# Patient Record
Sex: Male | Born: 1965 | Race: White | Hispanic: No | Marital: Married | Smoking: Never smoker
Health system: Southern US, Community
[De-identification: ages and names within clinical notes are randomized; demographics above are authoritative.]

## PROBLEM LIST (undated history)

## (undated) DIAGNOSIS — G4733 Obstructive sleep apnea (adult) (pediatric): Secondary | ICD-10-CM

## (undated) DIAGNOSIS — F39 Unspecified mood [affective] disorder: Secondary | ICD-10-CM

## (undated) DIAGNOSIS — K429 Umbilical hernia without obstruction or gangrene: Secondary | ICD-10-CM

## (undated) DIAGNOSIS — K760 Fatty (change of) liver, not elsewhere classified: Secondary | ICD-10-CM

## (undated) DIAGNOSIS — R7301 Impaired fasting glucose: Secondary | ICD-10-CM

## (undated) DIAGNOSIS — K5790 Diverticulosis of intestine, part unspecified, without perforation or abscess without bleeding: Secondary | ICD-10-CM

## (undated) DIAGNOSIS — F419 Anxiety disorder, unspecified: Secondary | ICD-10-CM

## (undated) DIAGNOSIS — E119 Type 2 diabetes mellitus without complications: Secondary | ICD-10-CM

## (undated) DIAGNOSIS — I10 Essential (primary) hypertension: Secondary | ICD-10-CM

## (undated) HISTORY — DX: Impaired fasting glucose: R73.01

## (undated) HISTORY — DX: Unspecified mood (affective) disorder: F39

## (undated) HISTORY — PX: ANKLE ARTHROTOMY: SUR101

## (undated) HISTORY — DX: Obstructive sleep apnea (adult) (pediatric): G47.33

## (undated) HISTORY — PX: CHOLECYSTECTOMY: SHX55

## (undated) HISTORY — DX: Umbilical hernia without obstruction or gangrene: K42.9

## (undated) HISTORY — DX: Anxiety disorder, unspecified: F41.9

## (undated) HISTORY — DX: Diverticulosis of intestine, part unspecified, without perforation or abscess without bleeding: K57.90

## (undated) HISTORY — DX: Fatty (change of) liver, not elsewhere classified: K76.0

---

## 2019-04-14 ENCOUNTER — Emergency Department (HOSPITAL_COMMUNITY)
Admission: EM | Admit: 2019-04-14 | Discharge: 2019-04-14 | Disposition: A | Discharge status: Home / Self Care | Payer: Self-pay | Attending: Emergency Medicine | Admitting: Emergency Medicine

## 2019-04-14 ENCOUNTER — Emergency Department (HOSPITAL_COMMUNITY): Payer: Self-pay

## 2019-04-14 ENCOUNTER — Encounter (HOSPITAL_COMMUNITY): Payer: Self-pay | Admitting: Emergency Medicine

## 2019-04-14 ENCOUNTER — Encounter: Payer: Self-pay | Admitting: Gastroenterology

## 2019-04-14 DIAGNOSIS — K625 Hemorrhage of anus and rectum: Secondary | ICD-10-CM | POA: Insufficient documentation

## 2019-04-14 DIAGNOSIS — K5730 Diverticulosis of large intestine without perforation or abscess without bleeding: Secondary | ICD-10-CM | POA: Insufficient documentation

## 2019-04-14 DIAGNOSIS — K5790 Diverticulosis of intestine, part unspecified, without perforation or abscess without bleeding: Secondary | ICD-10-CM

## 2019-04-14 HISTORY — DX: Essential (primary) hypertension: I10

## 2019-04-14 HISTORY — DX: Type 2 diabetes mellitus without complications: E11.9

## 2019-04-14 LAB — CBC
HCT: 46 % (ref 39.0–52.0)
Hemoglobin: 16 g/dL (ref 13.0–17.0)
MCH: 29.7 pg (ref 26.0–34.0)
MCHC: 34.8 g/dL (ref 30.0–36.0)
MCV: 85.3 fL (ref 80.0–100.0)
Platelets: 269 10*3/uL (ref 150–400)
RBC: 5.39 MIL/uL (ref 4.22–5.81)
RDW: 12.7 % (ref 11.5–15.5)
WBC: 7.3 10*3/uL (ref 4.0–10.5)
nRBC: 0 % (ref 0.0–0.2)

## 2019-04-14 LAB — POC OCCULT BLOOD, ED: Fecal Occult Bld: NEGATIVE

## 2019-04-14 LAB — URINALYSIS, ROUTINE W REFLEX MICROSCOPIC
Bacteria, UA: NONE SEEN
Bilirubin Urine: NEGATIVE
Glucose, UA: >=500 mg/dL — AB
Hgb urine dipstick: NEGATIVE
Ketones, ur: 20 mg/dL — AB
Leukocytes,Ua: NEGATIVE
Nitrite: NEGATIVE
Protein, ur: NEGATIVE mg/dL
Specific Gravity, Urine: 1.033 — ABNORMAL HIGH (ref 1.005–1.030)
pH: 5 (ref 5.0–8.0)

## 2019-04-14 LAB — COMPREHENSIVE METABOLIC PANEL
ALT: 32 U/L (ref 0–44)
AST: 22 U/L (ref 15–41)
Albumin: 4 g/dL (ref 3.5–5.0)
Alkaline Phosphatase: 117 U/L (ref 38–126)
Anion gap: 10 (ref 5–15)
BUN: 12 mg/dL (ref 6–20)
CO2: 23 mmol/L (ref 22–32)
Calcium: 9.2 mg/dL (ref 8.9–10.3)
Chloride: 101 mmol/L (ref 98–111)
Creatinine, Ser: 0.89 mg/dL (ref 0.61–1.24)
GFR calc Af Amer: >60 mL/min (ref >60)
GFR calc non Af Amer: >60 mL/min (ref >60)
Glucose, Bld: 265 mg/dL — ABNORMAL HIGH (ref 70–99)
Potassium: 4.1 mmol/L (ref 3.5–5.1)
Sodium: 134 mmol/L — ABNORMAL LOW (ref 135–145)
Total Bilirubin: 0.8 mg/dL (ref 0.3–1.2)
Total Protein: 7.3 g/dL (ref 6.5–8.1)

## 2019-04-14 LAB — TYPE AND SCREEN
ABO/RH(D): O POS
Antibody Screen: NEGATIVE

## 2019-04-14 LAB — LACTIC ACID, PLASMA: Lactic Acid, Venous: 1.1 mmol/L (ref 0.5–1.9)

## 2019-04-14 LAB — ABO/RH: ABO/RH(D): O POS

## 2019-04-14 LAB — LIPASE, BLOOD: Lipase: 31 U/L (ref 11–51)

## 2019-04-14 MED ORDER — IOHEXOL 300 MG/ML  SOLN
100.0000 mL | Freq: Once | INTRAMUSCULAR | Status: AC | PRN
  Administered 2019-04-14: 100 mL via INTRAVENOUS

## 2019-04-14 MED ORDER — SODIUM CHLORIDE 0.9 % IV BOLUS
1000.0000 mL | Freq: Once | INTRAVENOUS | Status: AC
  Administered 2019-04-14: 11:00:00 1000 mL via INTRAVENOUS

## 2019-04-14 MED ORDER — ONDANSETRON HCL 4 MG/2ML IJ SOLN
4.0000 mg | Freq: Once | INTRAMUSCULAR | Status: AC
  Administered 2019-04-14: 11:00:00 4 mg via INTRAVENOUS
  Filled 2019-04-14: qty 2

## 2019-04-14 MED ORDER — MORPHINE SULFATE (PF) 4 MG/ML IV SOLN
4.0000 mg | Freq: Once | INTRAVENOUS | Status: AC
  Administered 2019-04-14: 11:00:00 4 mg via INTRAVENOUS
  Filled 2019-04-14: qty 1

## 2019-04-14 NOTE — ED Provider Notes (Signed)
[facility] EMERGENCY DEPARTMENT Provider Note   CSN: [id number] Arrival date & time: 04/14/19  0932     History   Chief Complaint Chief Complaint  Patient presents with  . GI Bleeding    HPI [name] is a 53 y.o. male.     The history is provided by the patient, a friend and medical records. No language interpreter was used.  Abdominal Pain Pain location:  Generalized Pain quality: aching and cramping   Pain radiates to:  Does not radiate Pain severity:  Moderate Onset quality:  Gradual Duration:  2 days Timing:  Constant Progression:  Worsening Chronicity:  New Context: previous surgery (chole)   Context: not trauma   Relieved by:  Nothing Worsened by:  Nothing Associated symptoms: fatigue, melena and nausea   Associated symptoms: no chest pain, no chills, no constipation, no cough, no diarrhea, no dysuria, no fever, no flatus, no hematuria, no shortness of breath and no vomiting     No past medical history on file.  There are no active problems to display for this patient.   Past Surgical History:  Procedure Laterality Date  . ANKLE ARTHROTOMY    . CHOLECYSTECTOMY          Home Medications    Prior to Admission medications   Not on File    Family History No family history on file.  Social History Social History   Tobacco Use  . Smoking status: Not on file  Substance Use Topics  . Alcohol use: Not on file  . Drug use: Not on file     Allergies   Patient has no allergy information on record.   Review of Systems Review of Systems  Constitutional: Positive for fatigue. Negative for chills, diaphoresis and fever.  HENT: Negative for congestion and rhinorrhea.   Eyes: Negative for visual disturbance.  Respiratory: Negative for cough, choking, chest tightness, shortness of breath and wheezing.   Cardiovascular: Negative for chest pain.  Gastrointestinal: Positive for abdominal distention, abdominal pain, blood in  stool, melena and nausea. Negative for constipation, diarrhea, flatus and vomiting.  Genitourinary: Negative for dysuria, flank pain, frequency, hematuria, penile pain and testicular pain.  Musculoskeletal: Negative for back pain, neck pain and neck stiffness.  Skin: Negative for rash and wound.  Neurological: Positive for light-headedness. Negative for syncope, weakness, numbness and headaches.  Psychiatric/Behavioral: Negative for agitation.  All other systems reviewed and are negative.    Physical Exam Updated Vital Signs BP (!) 139/96   Pulse 90   Temp 98.5 F (36.9 C) (Oral)   Resp 13   Ht 5\' 10"  (1.778 m)   Wt 86.2 kg   SpO2 97%   BMI 27.26 kg/m   Physical Exam Vitals signs and nursing note reviewed.  Constitutional:      General: He is not in acute distress.    Appearance: He is well-developed. He is not ill-appearing, toxic-appearing or diaphoretic.  HENT:     Head: Normocephalic and atraumatic.     Nose: Nose normal. No congestion or rhinorrhea.     Mouth/Throat:     Mouth: Mucous membranes are dry.     Pharynx: Oropharynx is clear. No oropharyngeal exudate or posterior oropharyngeal erythema.  Eyes:     Extraocular Movements: Extraocular movements intact.     Conjunctiva/sclera: Conjunctivae normal.     Pupils: Pupils are equal, round, and reactive to light.  Neck:     Musculoskeletal: Neck supple. No muscular tenderness.  Cardiovascular:  Rate and Rhythm: Normal rate and regular rhythm.     Pulses: Normal pulses.     Heart sounds: No murmur.  Pulmonary:     Effort: Pulmonary effort is normal. No respiratory distress.     Breath sounds: Normal breath sounds.  Abdominal:     General: Abdomen is flat. Bowel sounds are normal. There is no distension.     Palpations: Abdomen is soft. There is no mass.     Tenderness: There is generalized abdominal tenderness. There is no right CVA tenderness or left CVA tenderness.     Hernia: A hernia is present. Hernia is  present in the umbilical area.     Comments: Periumbilical hernia easily reducible and not tender.  Mild diffuse abdominal tenderness.  Musculoskeletal:        General: No tenderness.     Right lower leg: No edema.     Left lower leg: No edema.  Skin:    General: Skin is warm and dry.     Capillary Refill: Capillary refill takes less than 2 seconds.     Coloration: Skin is pale.     Findings: No erythema or rash.  Neurological:     General: No focal deficit present.     Mental Status: He is alert.  Psychiatric:        Mood and Affect: Mood normal.      ED Treatments / Results  Labs (all labs ordered are listed, but only abnormal results are displayed) Labs Reviewed  COMPREHENSIVE METABOLIC PANEL - Abnormal; Notable for the following components:      Result Value   Sodium 134 (*)    Glucose, Bld 265 (*)    All other components within normal limits  URINALYSIS, ROUTINE W REFLEX MICROSCOPIC - Abnormal; Notable for the following components:   Specific Gravity, Urine 1.033 (*)    Glucose, UA >=500 (*)    Ketones, ur 20 (*)    All other components within normal limits  URINE CULTURE  CBC  LACTIC ACID, PLASMA  LIPASE, BLOOD  LACTIC ACID, PLASMA  POC OCCULT BLOOD, ED  TYPE AND SCREEN  ABO/RH    EKG None  Radiology Ct Abdomen Pelvis W Contrast  Result Date: 04/14/2019 CLINICAL DATA:  Diffuse abdominal pain and distension and rectal bleeding for 2 days. EXAM: CT ABDOMEN AND PELVIS WITH CONTRAST TECHNIQUE: Multidetector CT imaging of the abdomen and pelvis was performed using the standard protocol following bolus administration of intravenous contrast. CONTRAST:  100 mL OMNIPAQUE IOHEXOL 300 MG/ML  SOLN COMPARISON:  None. FINDINGS: Lower chest: Mild dependent atelectasis noted. No pleural or pericardial effusion. Hepatobiliary: No focal liver abnormality is seen. Status post cholecystectomy. No biliary dilatation. The liver is diffusely low attenuating consistent with fatty  infiltration. Pancreas: Unremarkable. No pancreatic ductal dilatation or surrounding inflammatory changes. Spleen: Normal in size without focal abnormality. Adrenals/Urinary Tract: Adrenal glands are unremarkable. Kidneys are normal, without renal calculi, focal lesion, or hydronephrosis. Bladder is unremarkable. Stomach/Bowel: Stomach is within normal limits. Appendix appears normal. No evidence of bowel wall thickening, distention, or inflammatory changes. A few sigmoid diverticula are noted. Vascular/Lymphatic: No significant vascular findings are present. No enlarged abdominal or pelvic lymph nodes. Reproductive: Prostate is unremarkable. Other: Fat containing umbilical hernia is seen. Musculoskeletal: The patient has bilateral L5 pars interarticularis defects without anterolisthesis L5 on S1. No acute bony abnormality is seen. No lytic or sclerotic lesion. IMPRESSION: No acute abnormality or finding to explain the patient's symptoms. Mild sigmoid diverticulosis  without diverticulitis. Fatty infiltration of the liver. Fat containing umbilical hernia. 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 Electronically Signed   By: Inge Rise M.D.   On: 04/14/2019 12:47    Procedures Procedures (including critical care time)  Medications Ordered in ED Medications  sodium chloride 0.9 % bolus 1,000 mL (0 mLs Intravenous Stopped 04/14/19 1232)  ondansetron (ZOFRAN) injection 4 mg (4 mg Intravenous Given 04/14/19 1117)  morphine 4 MG/ML injection 4 mg (4 mg Intravenous Given 04/14/19 1117)  iohexol (OMNIPAQUE) 300 MG/ML solution 100 mL (100 mLs Intravenous Contrast Given 04/14/19 1230)     Initial Impression / Assessment and Plan / ED Course  I have reviewed the triage vital signs and the nursing notes.  Pertinent labs & imaging results that were available during my care of the patient were reviewed by me and considered in my medical decision making (see chart for  details).        James Rollins is a 53 y.o. male with a past medical history significant for hypertension and diabetes who presents with abdominal pain, fatigue, lightheadedness, nausea, and rectal bleeding.  Patient reports that he is never had these symptoms in the past.  He reports that for the last 2 days he has had severe abdominal pain as well as rectal bleeding.  He reports it has been dark blood.  He says that he has had his gallbladder removed in the past and has had a small bulge near his umbilicus that has been going on for the last month or so.  He reports it is not exquisitely tender there.  He reports nausea but no vomiting.  No hematemesis.  No history of hemorrhoids or diverticulitis or diverticulosis.  No history of aortic problems.  He reports his blood pressure was elevated in the past but is currently in the 150s on arrival.  He denies fevers, chills, chest pain, shortness of breath, or palpitations.  He does report feeling lightheaded and got very fatigued when he tries to ambulate.  He thinks he is more pale than normal.  He denies any groin or urinary symptoms. He denies blood thinner use.   On exam, abdomen is mildly tender diffusely.  Fecal occult test was collected with there was no gross blood on my rectal exam.  No hemorrhoids palpated.  Lungs clear and chest is nontender.  Back and flanks nontender.  CVA areas nontender.  Patient does appear pale and mucous membranes and dry mucous membranes.  Patient did have what appears to be a umbilical hernia however it was reducible by me and was not exquisitely tender.  No palpable/pulsatile masses in abdomen.  Patient given pain medicine, nausea, and fluids.  Will get fecal occult test and labs.  Will type and screen him.  Due to the rectal bleeding and abdominal pain with nausea, will get CT scan to look for diverticulitis or diverticulosis.  Patient does report that he is never had a colonoscopy and family with him is concerned about  colon cancer.  Apparently both of the patient's parents had colon cancer and he has not yet been evaluated for this.  The person accompanying him is concerned that this was going on.  Patient was informed that a CT scan is likely not the best way to work-up a possible colon cancer however we do need to rule out diverticulitis today.  We will get the labs, imaging, and medications be administered.  Anticipate reassessment after work-up.   3:26  PM Patient's diagnostic work-up was overall reassuring.  Fecal occult test was negative and patient is not anemic.  Diverticulosis was seen on CT scan, I suspect he is having intermittent bleeding from diverticulosis.  Other work-up was overall reassuring.  Patient agreed with plans to follow-up with a gastroenterologist for likely colonoscopy given strong colon cancer family history.  No other abnormalities were found and patient was feeling better.  Patient be discharged with instructions to maintain hydration and follow-up.  He had otherwise or concerns and was discharged in good condition.   Final Clinical Impressions(s) / ED Diagnoses   Final diagnoses:  Diverticulosis  Rectal bleeding    ED Discharge Orders    None      Clinical Impression: 1. Diverticulosis   2. Rectal bleeding     Disposition: Discharge  Condition: Good  I have discussed the results, Dx and Tx plan with the pt(& family if present). He/she/they expressed understanding and agree(s) with the plan. Discharge instructions discussed at great length. Strict return precautions discussed and pt &/or family have verbalized understanding of the instructions. No further questions at time of discharge.    New Prescriptions   No medications on file    Follow Up: Gastroenterology, [address] [place] [address] [phone]     [facility] [place] [phone]       Tegeler,  Gwenyth Allegra, MD 04/14/19 [phone]

## 2019-04-14 NOTE — Discharge Instructions (Signed)
Your work-up today was significant for diverticulosis is the likely cause of the intermittent rectal bleeding.  We did not see any blood on your work-up today and you are not anemic.  We did not feeling admission but we do think that follow-up with gastroneurology is prudent.  Please call 1 of the 2 gastroenterology groups provided for follow-up and likely colonoscopy.  Please maintain your hydration.  If any symptoms change or worsen, please return to nearest emergency department.

## 2019-04-14 NOTE — ED Notes (Signed)
Pt verbalized understanding of discharge instructions and denies any further questions at this time.   

## 2019-04-14 NOTE — ED Triage Notes (Signed)
Pt with 2 days of dark stools consistent with blood. Denies hx of the same. He also reports abdominal distention and nausea, no vomiting or fever.

## 2019-04-16 LAB — URINE CULTURE: Culture: NO GROWTH

## 2019-04-20 ENCOUNTER — Emergency Department (HOSPITAL_COMMUNITY): Payer: No Typology Code available for payment source

## 2019-04-20 ENCOUNTER — Emergency Department (HOSPITAL_COMMUNITY)
Admission: EM | Admit: 2019-04-20 | Discharge: 2019-04-20 | Disposition: A | Discharge status: Home / Self Care | Payer: No Typology Code available for payment source | Attending: Emergency Medicine | Admitting: Emergency Medicine

## 2019-04-20 ENCOUNTER — Encounter (HOSPITAL_COMMUNITY): Payer: Self-pay | Admitting: Emergency Medicine

## 2019-04-20 ENCOUNTER — Other Ambulatory Visit: Payer: Self-pay

## 2019-04-20 DIAGNOSIS — E119 Type 2 diabetes mellitus without complications: Secondary | ICD-10-CM | POA: Diagnosis not present

## 2019-04-20 DIAGNOSIS — I10 Essential (primary) hypertension: Secondary | ICD-10-CM | POA: Insufficient documentation

## 2019-04-20 DIAGNOSIS — Y990 Civilian activity done for income or pay: Secondary | ICD-10-CM | POA: Insufficient documentation

## 2019-04-20 DIAGNOSIS — M25572 Pain in left ankle and joints of left foot: Secondary | ICD-10-CM | POA: Diagnosis not present

## 2019-04-20 DIAGNOSIS — M25562 Pain in left knee: Secondary | ICD-10-CM | POA: Diagnosis present

## 2019-04-20 DIAGNOSIS — Z7984 Long term (current) use of oral hypoglycemic drugs: Secondary | ICD-10-CM | POA: Insufficient documentation

## 2019-04-20 DIAGNOSIS — Z79899 Other long term (current) drug therapy: Secondary | ICD-10-CM | POA: Diagnosis not present

## 2019-04-20 MED ORDER — HYDROCODONE-ACETAMINOPHEN 5-325 MG PO TABS: 1.0000 | ORAL_TABLET | ORAL | 0 refills | Status: DC | PRN

## 2019-04-20 MED ORDER — HYDROCODONE-ACETAMINOPHEN 5-325 MG PO TABS
1.0000 | ORAL_TABLET | Freq: Once | ORAL | Status: AC
  Administered 2019-04-20: 1 via ORAL
  Filled 2019-04-20: qty 1

## 2019-04-20 NOTE — ED Notes (Signed)
Patient verbalizes understanding of discharge instructions . Opportunity for questions and answers were provided . Armband removed by staff ,Pt discharged from ED. W/C  offered at D/C  and Declined W/C at D/C and was escorted to lobby by RN.  

## 2019-04-20 NOTE — Discharge Instructions (Addendum)
The xray of your left knee was reassuring today - no evidence of breaks.  The x ray of your left ankle did not show any breaks but there is some widening of the ankle joint with concern for a ligament injury.  Please use cam walker and crutches until you can follow up with your orthopedist. It is highly recommended to not bare any weight onto that ankle until you can be seen by them.  I have prescribed short course of pain medication for you to take as needed. It is recommended to try Ibuprofen first and only take the narcotic pain medication if you have continued pain.  While at home please rest, ice, and elevate ankle for comfort.

## 2019-04-20 NOTE — ED Triage Notes (Signed)
Pt reports falling down some steps today and twisting his left knee and ankle. Pt has taken 800 mg motrin.

## 2019-04-20 NOTE — Progress Notes (Signed)
Orthopedic Tech Progress Note Patient Details:  James Rollins 05-Dec-1965 [id number]  Ortho Devices Type of Ortho Device: Crutches, CAM walker Ortho Device/Splint Interventions: Application   Post Interventions Patient Tolerated: Well Instructions Provided: Care of device   [place] Pink 04/20/2019, 5:23 PM

## 2019-04-20 NOTE — ED Provider Notes (Signed)
[facility] EMERGENCY DEPARTMENT Provider Note   CSN: [id number] Arrival date & time: 04/20/19  1419     History   Chief Complaint Chief Complaint  Patient presents with  . Knee Pain  . Ankle Injury    HPI James Rollins is a 53 y.o. male with PMHx HTN and diabetes who presents to the ED today for sudden onset, constant, sharp, left ankle pain s/p injury that occurred this morning.  Reports that he was walking down steps when he rolled his left ankle causing him to fall.  He states he twisted his left knee in the process as well causing pain.  No head injury or loss of consciousness.  Patient did take 800 mg of Motrin around noon with mild relief.  He states worsening pain with ambulation prompting him to come to the ED today.  He reports chronic numbness after surgery on her right foot in the past.  He states this is unchanged.  Denies fever, chills, any other associated symptoms.        Past Medical History:  Diagnosis Date  . Diabetes mellitus without complication ([facility])   . Hypertension     There are no active problems to display for this patient.   Past Surgical History:  Procedure Laterality Date  . ANKLE ARTHROTOMY    . CHOLECYSTECTOMY          Home Medications    Prior to Admission medications   Medication Sig Start Date End Date Taking? Authorizing Provider  acetaminophen (TYLENOL) 325 MG tablet Take 975 mg by mouth every 6 (six) hours as needed.    [provider]  HYDROcodone-acetaminophen (NORCO/VICODIN) 5-325 MG tablet Take 1 tablet by mouth every 4 (four) hours as needed. 04/20/19   Kimmie Doren, PA-C  lisinopril (ZESTRIL) 5 MG tablet Take 5 mg by mouth daily.    [provider]  metFORMIN (GLUCOPHAGE) 500 MG tablet Take 500 mg by mouth 2 (two) times daily with a meal.    [provider]  sertraline (ZOLOFT) 100 MG tablet Take 100 mg by mouth daily.    [provider]    Family History No family  history on file.  Social History Social History   Tobacco Use  . Smoking status: Never Smoker  . Smokeless tobacco: Never Used  Substance Use Topics  . Alcohol use: Not Currently  . Drug use: Not Currently     Allergies   Oxycontin [oxycodone hcl], Onion, and Yellow jacket venom [bee venom]   Review of Systems Review of Systems  Constitutional: Negative for chills and fever.  Musculoskeletal: Positive for arthralgias and joint swelling.  Neurological: Negative for syncope and headaches.     Physical Exam Updated Vital Signs BP (!) 145/88   Pulse 86   Temp 98 F (36.7 C) (Oral)   Resp 15   Ht 5\' 10"  (1.778 m)   Wt 86 kg   SpO2 95%   BMI 27.20 kg/m   Physical Exam Vitals signs and nursing note reviewed.  Constitutional:      Appearance: He is not ill-appearing.  HENT:     Head: Normocephalic and atraumatic.  Eyes:     Conjunctiva/sclera: Conjunctivae normal.  Cardiovascular:     Rate and Rhythm: Normal rate and regular rhythm.  Pulmonary:     Effort: Pulmonary effort is normal.     Breath sounds: Normal breath sounds.  Musculoskeletal:     Comments: Mild swelling noted to left knee as well  as left ankle.  Patient does have tenderness to the lateral aspect of the left knee.  Negative anterior and posterior drawer test.  No varus or valgus laxity of the knee.  Tenderness diffusely to left ankle along the lateral and medial malleolus.  Range of motion limited due to swelling at the ankle.  No tenderness to the foot.  Strength 4 out of 5 with plantarflexion, dorsiflexion, knee extension, knee flexion compared to right.  Sensation intact throughout.  2+ PT and DP pulse.   Skin:    General: Skin is warm and dry.     Coloration: Skin is not jaundiced.  Neurological:     Mental Status: He is alert.      ED Treatments / Results  Labs (all labs ordered are listed, but only abnormal results are displayed) Labs Reviewed - No data to display  EKG None   Radiology Dg Knee 2 Views Left  Result Date: 04/20/2019 CLINICAL DATA:  [place] down steps today with left knee pain. EXAM: LEFT KNEE - 1-2 VIEW COMPARISON:  None. FINDINGS: No evidence of fracture, dislocation, or joint effusion. No evidence of arthropathy or other focal bone abnormality. Soft tissues are unremarkable. IMPRESSION: Negative. Electronically Signed   By: Marin Olp M.D.   On: 04/20/2019 16:05   Dg Ankle Complete Left  Result Date: 04/20/2019 CLINICAL DATA:  Fall down steps today with left ankle pain. EXAM: LEFT ANKLE COMPLETE - 3+ VIEW COMPARISON:  None. FINDINGS: Degenerative changes of the tibiotalar joint with slight widening of the lateral ankle mortise. Multiple with fatigue screws over the talus with ankylosis of the talocalcaneal joint. Mild degenerate change over the midfoot. No acute fracture or dislocation. IMPRESSION: No acute fracture. Chronic and postsurgical changes of the tibiotalar joint and hindfoot. Slight widening of the lateral ankle mortise which may be acute or chronic. Electronically Signed   By: Marin Olp M.D.   On: 04/20/2019 16:07    Procedures Procedures (including critical care time)  Medications Ordered in ED Medications  HYDROcodone-acetaminophen (NORCO/VICODIN) 5-325 MG per tablet 1 tablet (1 tablet Oral Given 04/20/19 1649)     Initial Impression / Assessment and Plan / ED Course  I have reviewed the triage vital signs and the nursing notes.  Pertinent labs & imaging results that were available during my care of the patient were reviewed by me and considered in my medical decision making (see chart for details).    53 year old male who presents to the ED after ankle and knee injury today.  Was walking down steps when he twisted his ankle and his knee causing immediate pain.  Joints mildly swollen with tenderness to palpation.  No ligamentous injury to knee.  Xray of knee obtained which was negative.  Stray of ankle was obtained which was  negative for fractures.  There does appear to be some widening of the mortise which radiologist reported could either be acute or chronic.  Does have orthopedist locally.  I have provided Cam walker and crutches in the ED today as well as pain medication.  I have advised strictly to not bear weight on the ankle as there is some concern for ligamentous injury until he can follow-up with orthopedics.   This note was prepared using Dragon voice recognition software and may include unintentional dictation errors due to the inherent limitations of voice recognition software.       Final Clinical Impressions(s) / ED Diagnoses   Final diagnoses:  Acute pain of left knee  Acute left ankle pain    ED Discharge Orders         Ordered    HYDROcodone-acetaminophen (NORCO/VICODIN) 5-325 MG tablet  Every 4 hours PRN     04/20/19 1708           Eustaquio Maize, PA-C 04/20/19 1726    Elnora Morrison, MD 04/22/19 [phone]

## 2019-04-20 NOTE — ED Notes (Signed)
Patient transported to X-ray 

## 2019-05-15 ENCOUNTER — Ambulatory Visit: Payer: Self-pay | Admitting: Gastroenterology

## 2019-06-11 ENCOUNTER — Ambulatory Visit (INDEPENDENT_AMBULATORY_CARE_PROVIDER_SITE_OTHER): Payer: BC Managed Care – PPO | Admitting: Gastroenterology

## 2019-06-11 ENCOUNTER — Encounter: Payer: Self-pay | Admitting: Gastroenterology

## 2019-06-11 VITALS — BP 120/82 | HR 102 | Temp 97.5°F | Ht 70.0 in | Wt 200.0 lb

## 2019-06-11 DIAGNOSIS — K625 Hemorrhage of anus and rectum: Secondary | ICD-10-CM

## 2019-06-11 DIAGNOSIS — K529 Noninfective gastroenteritis and colitis, unspecified: Secondary | ICD-10-CM | POA: Diagnosis not present

## 2019-06-11 DIAGNOSIS — Z1159 Encounter for screening for other viral diseases: Secondary | ICD-10-CM | POA: Diagnosis not present

## 2019-06-11 MED ORDER — COLESEVELAM HCL 625 MG PO TABS: 625.0000 mg | ORAL_TABLET | Freq: Two times a day (BID) | ORAL | 0 refills | Status: DC

## 2019-06-11 MED ORDER — NA SULFATE-K SULFATE-MG SULF 17.5-3.13-1.6 GM/177ML PO SOLN: 1.0000 | Freq: Once | ORAL | 0 refills | Status: AC

## 2019-06-11 NOTE — Progress Notes (Signed)
[facility] Consult Note:  History: James Rollins 06/11/2019  Referring provider:   Reason for consult/chief complaint: Rectal bleeding  Subjective  HPI:  James Rollins went to the emergency department in late September for about 2 days of small-volume rectal bleeding.  He was having some lower abdominal bloating but says there was no real pain at the time.  ED provider note was reviewed, he had work-up as noted below and stool was reportedly heme negative.  Symptoms resolved after that, but he felt he should be evaluated since he had never had a colonoscopy.  ED provider note indicated history of colon cancer in both parents, but James Rollins says there is no family history of colon cancer.  He also has been bothered by chronic diarrhea since his cholecystectomy many years ago.  He has 3-5 loose stools per day, usually soon after eating.  He had not had any previous testing or taking any medicines except for occasional Imodium.  ROS:  Review of Systems  Constitutional: Negative for appetite change and unexpected weight change.  HENT: Negative for mouth sores and voice change.   Eyes: Negative for pain and redness.  Respiratory: Negative for cough and shortness of breath.   Cardiovascular: Negative for chest pain and palpitations.  Genitourinary: Negative for dysuria and hematuria.  Musculoskeletal: Negative for arthralgias and myalgias.       Left foot pain after recent injury.  Currently in an orthopedic boot.  Skin: Negative for pallor and rash.  Neurological: Negative for weakness and headaches.  Hematological: Negative for adenopathy.     Past Medical History: Past Medical History:  Diagnosis Date  . Anxiety   . Diabetes mellitus without complication ([place])   . Diverticulosis   . Fatty liver    per CT 03-2019  . Hypertension   . Impaired fasting glucose   . Mood disorder ([place])   . OSA (obstructive sleep apnea)   . Umbilical hernia    per CT- 04-14-2019      Past Surgical History: Past Surgical History:  Procedure Laterality Date  . ANKLE ARTHROTOMY    . CHOLECYSTECTOMY       Family History: Family History  Problem Relation Age of Onset  . Brain cancer Mother   . Hypertension Father   . Hyperlipidemia Father   . Diabetes Mellitus II Father   . Heart disease Father   . COPD Father     Social History: Social History   Socioeconomic History  . Marital status: Married    Spouse name: Not on file  . Number of children: Not on file  . Years of education: Not on file  . Highest education level: Not on file  Occupational History  . Not on file  Social Needs  . Financial resource strain: Not on file  . Food insecurity    Worry: Not on file    Inability: Not on file  . Transportation needs    Medical: Not on file    Non-medical: Not on file  Tobacco Use  . Smoking status: Never Smoker  . Smokeless tobacco: Never Used  Substance and Sexual Activity  . Alcohol use: Not Currently  . Drug use: Not Currently  . Sexual activity: Not on file  Lifestyle  . Physical activity    Days per week: Not on file    Minutes per session: Not on file  . Stress: Not on file  Relationships  . Social connections    Talks on phone: Not on file  Gets together: Not on file    Attends religious service: Not on file    Active member of club or organization: Not on file    Attends meetings of clubs or organizations: Not on file    Relationship status: Not on file  Other Topics Concern  . Not on file  Social History Narrative  . Not on file    Allergies: Allergies  Allergen Reactions  . Oxycontin [Oxycodone Hcl] Other (See Comments)    Mood swings  . Onion Other (See Comments)    Dries out throat  . [place]et Venom [Bee Venom] Nausea Only and Other (See Comments)    Outpatient Meds: Current Outpatient Medications  Medication Sig Dispense Refill  . acetaminophen (TYLENOL) 325 MG tablet Take 975 mg by mouth every 6 (six) hours  as needed.    Marland Kitchen HYDROcodone-acetaminophen (NORCO/VICODIN) 5-325 MG tablet Take 1 tablet by mouth every 4 (four) hours as needed. 10 tablet 0  . lisinopril (ZESTRIL) 5 MG tablet Take 5 mg by mouth daily.    . metFORMIN (GLUCOPHAGE) 500 MG tablet Take 500 mg by mouth 2 (two) times daily with a meal.    . sertraline (ZOLOFT) 100 MG tablet Take 100 mg by mouth daily.    . colesevelam (WELCHOL) 625 MG tablet Take 1 tablet (625 mg total) by mouth 2 (two) times daily with a meal for 14 days. 28 tablet 0  . Na Sulfate-K Sulfate-Mg Sulf 17.5-3.13-1.6 GM/177ML SOLN Take 1 kit by mouth once for 1 dose. 354 mL 0   No current facility-administered medications for this visit.       ___________________________________________________________________ Objective   Exam:  BP 120/82 (BP Location: Left Arm, Patient Position: Sitting, Cuff Size: Normal)   Pulse (!) 102   Temp (!) 97.5 F (36.4 C) (Other (Comment))   Ht _0  (1.778 m)   Wt 200 lb (90.7 kg)   BMI 28.70 kg/m    General: Well-appearing  Eyes: sclera anicteric, no redness  ENT: oral mucosa moist without lesions, no cervical or supraclavicular lymphadenopathy  CV: RRR without murmur, S1/S2, no JVD, no peripheral edema  Resp: clear to auscultation bilaterally, normal RR and effort noted  GI: soft, no tenderness, with active bowel sounds. No guarding or palpable organomegaly noted.  Skin; warm and dry, no rash or jaundice noted  Neuro: awake, alert and oriented x 3. Normal gross motor function and fluent speech Rectal exam was deferred.  Done during ED visit and will have upcoming procedure Labs:  CBC Latest Ref Rng & Units 04/14/2019  WBC 4.0 - 10.5 K/uL 7.3  Hemoglobin 13.0 - 17.0 g/dL 16.0  Hematocrit 39.0 - 52.0 % 46.0  Platelets 150 - 400 K/uL 269   CMP Latest Ref Rng & Units 04/14/2019  Glucose 70 - 99 mg/dL 265(H)  BUN 6 - 20 mg/dL 12  Creatinine 0.61 - 1.24 mg/dL 0.89  Sodium 135 - 145 mmol/L 134(L)  Potassium 3.5  - 5.1 mmol/L 4.1  Chloride 98 - 111 mmol/L 101  CO2 22 - 32 mmol/L 23  Calcium 8.9 - 10.3 mg/dL 9.2  Total Protein 6.5 - 8.1 g/dL 7.3  Total Bilirubin 0.3 - 1.2 mg/dL 0.8  Alkaline Phos 38 - 126 U/L 117  AST 15 - 41 U/L 22  ALT 0 - 44 U/L 32     Radiologic Studies:  CLINICAL DATA:  Diffuse abdominal pain and distension and rectal bleeding for 2 days.   EXAM: CT ABDOMEN AND PELVIS  WITH CONTRAST   TECHNIQUE: Multidetector CT imaging of the abdomen and pelvis was performed using the standard protocol following bolus administration of intravenous contrast.   CONTRAST:  100 mL OMNIPAQUE IOHEXOL 300 MG/ML  SOLN   COMPARISON:  None.   FINDINGS: Lower chest: Mild dependent atelectasis noted. No pleural or pericardial effusion.   Hepatobiliary: No focal liver abnormality is seen. Status post cholecystectomy. No biliary dilatation. The liver is diffusely low attenuating consistent with fatty infiltration.   Pancreas: Unremarkable. No pancreatic ductal dilatation or surrounding inflammatory changes.   Spleen: Normal in size without focal abnormality.   Adrenals/Urinary Tract: Adrenal glands are unremarkable. Kidneys are normal, without renal calculi, focal lesion, or hydronephrosis. Bladder is unremarkable.   Stomach/Bowel: Stomach is within normal limits. Appendix appears normal. No evidence of bowel wall thickening, distention, or inflammatory changes. A few sigmoid diverticula are noted.   Vascular/Lymphatic: No significant vascular findings are present. No enlarged abdominal or pelvic lymph nodes.   Reproductive: Prostate is unremarkable.   Other: Fat containing umbilical hernia is seen.   Musculoskeletal: The patient has bilateral L5 pars interarticularis defects without anterolisthesis L5 on S1. No acute bony abnormality is seen. No lytic or sclerotic lesion.   IMPRESSION: No acute abnormality or finding to explain the patient's symptoms.   Mild sigmoid  diverticulosis without diverticulitis.   Fatty infiltration of the liver.   Fat containing umbilical hernia.   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     Electronically Signed   By: Inge Rise M.D.   On: 04/14/2019 12:47   Assessment: Encounter Diagnoses  Name Primary?  . Rectal bleeding Yes  . Chronic diarrhea   . Screening for viral disease     Recent self-limited small-volume rectal bleeding, normal hemoglobin and CT scan.  Probably benign anorectal bleeding, but certainly should have colonoscopy for further evaluation.  Chronic diarrhea ever since cholecystectomy, sounds like to be bile acid diarrhea.  We discussed the nature of the condition and available treatments.  Plan:  Colonoscopy scheduled.  He was agreeable after discussion of procedure and risks.  The benefits and risks of the planned procedure were described in detail with the patient or (when appropriate) their health care proxy.  Risks were outlined as including, but not limited to, bleeding, infection, perforation, adverse medication reaction leading to cardiac or pulmonary decompensation, pancreatitis (if ERCP).  The limitation of incomplete mucosal visualization was also discussed.  No guarantees or warranties were given.  Trial of WelChol 625 mg, 1 tablet twice daily with meals.  14-day trial was given.  Thank you for the courtesy of this consult.  Please call me with any questions or concerns.  Nelida Meuse III  CC: Referring provider noted above

## 2019-06-11 NOTE — Patient Instructions (Addendum)
If you are age 51 or older, your body mass index should be between 23-30. Your Body mass index is 28.7 kg/m. If this is out of the aforementioned range listed, please consider follow up with your Primary Care Provider.  If you are age 44 or younger, your body mass index should be between 19-25. Your Body mass index is 28.7 kg/m. If this is out of the aformentioned range listed, please consider follow up with your Primary Care Provider.   You have been scheduled for a colonoscopy. Please follow written instructions given to you at your visit today.  Please pick up your prep supplies at the pharmacy within the next 1-3 days. If you use inhalers (even only as needed), please bring them with you on the day of your procedure. Your physician has requested that you go to www.startemmi.com and enter the access code given to you at your visit today. This web site gives a general overview about your procedure. However, you should still follow specific instructions given to you by our office regarding your preparation for the procedure.  Due to recent COVID-19 restrictions implemented by [employer] and state authorities and in an effort to keep both patients and staff as safe as possible, [place] requires COVID-19 testing prior to any scheduled endoscopic procedure. The testing center is located at [address] [place], [place] [address] in the [facility]  suite.  Your appointment has been scheduled for 06-19-2019 at 10:50am.   Please bring your insurance cards to this appointment. You will require your COVID screen 2 business days prior to your endoscopic procedure.  You are not required to quarantine after your screening.  You will only receive a phone call with the results if it is POSITIVE.  If you do not receive a call the day before your procedure you should begin your prep, if ordered, and you should report to the endo center for your  procedure at your designated appointment arrival time ( one hour prior to the procedure time). There is no cost to you for the screening on the day of the swab.  [facility] will file with your insurance company for the testing.    You may receive an automated phone call prior to your procedure or have a message in your MyChart that you have an appointment for a BP/15 at the [facility], please disregard this message.  Your testing will be at the [place] [place] location.   If you are leaving [facility] travel [place] on [address], turn left onto [address], turn night onto [address] at the 1st stop light turn right, pass the [employer] on your right and proceed to [address] (white building).   It was a pleasure to see you today!  Dr. Loletha Carrow

## 2019-06-19 ENCOUNTER — Ambulatory Visit (INDEPENDENT_AMBULATORY_CARE_PROVIDER_SITE_OTHER): Payer: BC Managed Care – PPO

## 2019-06-19 ENCOUNTER — Encounter: Payer: Self-pay | Admitting: Gastroenterology

## 2019-06-19 ENCOUNTER — Other Ambulatory Visit: Payer: Self-pay | Admitting: Gastroenterology

## 2019-06-19 DIAGNOSIS — Z1159 Encounter for screening for other viral diseases: Secondary | ICD-10-CM

## 2019-06-20 LAB — SARS CORONAVIRUS 2 (TAT 6-24 HRS): SARS Coronavirus 2: NEGATIVE

## 2019-06-23 ENCOUNTER — Other Ambulatory Visit: Payer: Self-pay | Admitting: Gastroenterology

## 2019-06-23 ENCOUNTER — Ambulatory Visit (AMBULATORY_SURGERY_CENTER): Payer: BC Managed Care – PPO | Admitting: Gastroenterology

## 2019-06-23 ENCOUNTER — Other Ambulatory Visit: Payer: Self-pay

## 2019-06-23 ENCOUNTER — Encounter: Payer: Self-pay | Admitting: Gastroenterology

## 2019-06-23 VITALS — BP 113/73 | HR 84 | Temp 98.7°F | Resp 16 | Ht 70.0 in | Wt 200.0 lb

## 2019-06-23 DIAGNOSIS — K5730 Diverticulosis of large intestine without perforation or abscess without bleeding: Secondary | ICD-10-CM | POA: Diagnosis not present

## 2019-06-23 DIAGNOSIS — D122 Benign neoplasm of ascending colon: Secondary | ICD-10-CM

## 2019-06-23 DIAGNOSIS — K625 Hemorrhage of anus and rectum: Secondary | ICD-10-CM

## 2019-06-23 MED ORDER — COLESEVELAM HCL 625 MG PO TABS: 625.0000 mg | ORAL_TABLET | Freq: Two times a day (BID) | ORAL | 5 refills | Status: DC

## 2019-06-23 MED ORDER — SODIUM CHLORIDE 0.9 % IV SOLN: 500.0000 mL | Freq: Once | INTRAVENOUS | Status: DC

## 2019-06-23 NOTE — Progress Notes (Signed)
PT taken to PACU. Monitors in place. VSS. Report given to RN. 

## 2019-06-23 NOTE — Progress Notes (Signed)
Called to room to assist during endoscopic procedure.  Patient ID and intended procedure confirmed with present staff. Received instructions for my participation in the procedure from the performing physician.  

## 2019-06-23 NOTE — Patient Instructions (Signed)
Please read handouts provided. Continue present medications. Await pathology results.        YOU HAD AN ENDOSCOPIC PROCEDURE TODAY AT THE  ENDOSCOPY CENTER:   Refer to the procedure report that was given to you for any specific questions about what was found during the examination.  If the procedure report does not answer your questions, please call your gastroenterologist to clarify.  If you requested that your care partner not be given the details of your procedure findings, then the procedure report has been included in a sealed envelope for you to review at your convenience later.  YOU SHOULD EXPECT: Some feelings of bloating in the abdomen. Passage of more gas than usual.  Walking can help get rid of the air that was put into your GI tract during the procedure and reduce the bloating. If you had a lower endoscopy (such as a colonoscopy or flexible sigmoidoscopy) you may notice spotting of blood in your stool or on the toilet paper. If you underwent a bowel prep for your procedure, you may not have a normal bowel movement for a few days.  Please Note:  You might notice some irritation and congestion in your nose or some drainage.  This is from the oxygen used during your procedure.  There is no need for concern and it should clear up in a day or so.  SYMPTOMS TO REPORT IMMEDIATELY:   Following lower endoscopy (colonoscopy or flexible sigmoidoscopy):  Excessive amounts of blood in the stool  Significant tenderness or worsening of abdominal pains  Swelling of the abdomen that is new, acute  Fever of 100F or higher    For urgent or emergent issues, a gastroenterologist can be reached at any hour by calling [phone].   DIET:  We do recommend a small meal at first, but then you may proceed to your regular diet.  Drink plenty of fluids but you should avoid alcoholic beverages for 24 hours.  ACTIVITY:  You should plan to take it easy for the rest of today and you should NOT  DRIVE or use heavy machinery until tomorrow (because of the sedation medicines used during the test).    FOLLOW UP: Our staff will call the number listed on your records 48-72 hours following your procedure to check on you and address any questions or concerns that you may have regarding the information given to you following your procedure. If we do not reach you, we will leave a message.  We will attempt to reach you two times.  During this call, we will ask if you have developed any symptoms of COVID 19. If you develop any symptoms (ie: fever, flu-like symptoms, shortness of breath, cough etc.) before then, please call [phone].  If you test positive for Covid 19 in the 2 weeks post procedure, please call and report this information to us.    If any biopsies were taken you will be contacted by phone or by letter within the next 1-3 weeks.  Please call us at [phone] if you have not heard about the biopsies in 3 weeks.    SIGNATURES/CONFIDENTIALITY: You and/or your care partner have signed paperwork which will be entered into your electronic medical record.  These signatures attest to the fact that that the information above on your After Visit Summary has been reviewed and is understood.  Full responsibility of the confidentiality of this discharge information lies with you and/or your care-partner. 

## 2019-06-23 NOTE — Op Note (Signed)
[facility] Patient Name: James Rollins Procedure Date: 06/23/2019 11:07 AM MRN: [id number] Endoscopist: Douglas. Loletha Carrow , MD Age: 53 Referring MD:  Date of Birth: Oct 28, 1965 Gender: Male Account #: [id number] Procedure:                Colonoscopy Indications:              Rectal bleeding Medicines:                Monitored Anesthesia Care Procedure:                Pre-Anesthesia Assessment:                           - Prior to the procedure, a History and Physical                            was performed, and patient medications and                            allergies were reviewed. The patient's tolerance of                            previous anesthesia was also reviewed. The risks                            and benefits of the procedure and the sedation                            options and risks were discussed with the patient.                            All questions were answered, and informed consent                            was obtained. Prior Anticoagulants: The patient has                            taken no previous anticoagulant or antiplatelet                            agents. ASA Grade Assessment: II - A patient with                            mild systemic disease. After reviewing the risks                            and benefits, the patient was deemed in                            satisfactory condition to undergo the procedure.                           After obtaining informed consent, the colonoscope  was passed under direct vision. Throughout the                            procedure, the patient's blood pressure, pulse, and                            oxygen saturations were monitored continuously. The                            Colonoscope was introduced through the anus and                            advanced to the the cecum, identified by                            appendiceal orifice and ileocecal valve. The                colonoscopy was performed without difficulty. The                            patient tolerated the procedure well. The quality                            of the bowel preparation was excellent. The                            ileocecal valve, appendiceal orifice, and rectum                            were photographed. Scope In: 11:15:11 AM Scope Out: 11:27:07 AM Scope Withdrawal Time: 0 hours 8 minutes 56 seconds  Total Procedure Duration: 0 hours 11 minutes 56 seconds  Findings:                 The perianal and digital rectal examinations were                            normal.                           A 4 mm polyp was found in the ascending colon. The                            polyp was sessile. The polyp was removed with a                            cold snare. Resection and retrieval were complete.                           Diverticula were found in the left colon.                           The exam was otherwise without abnormality on  direct and retroflexion views. Complications:            No immediate complications. Estimated Blood Loss:     Estimated blood loss was minimal. Impression:               - One 4 mm polyp in the ascending colon, removed                            with a cold snare. Resected and retrieved.                           - Diverticulosis in the left colon.                           - The examination was otherwise normal on direct                            and retroflexion views.                           Recent episode self-limited benign anal bleeding. Recommendation:           - Patient has a contact number available for                            emergencies. The signs and symptoms of potential                            delayed complications were discussed with the                            patient. Return to normal activities tomorrow.                            Written discharge instructions were provided to  the                            patient.                           - Resume previous diet.                           - Continue present medications.                           - Await pathology results.                           - Repeat colonoscopy is recommended for                            surveillance. The colonoscopy date will be                            determined after pathology results from today's  exam become available for review. James Rollins L. Loletha Carrow, MD 06/23/2019 11:30:06 AM This report has been signed electronically.

## 2019-06-25 ENCOUNTER — Telehealth: Payer: Self-pay

## 2019-06-25 NOTE — Telephone Encounter (Signed)
  Follow up Call-  Call back number 06/23/2019  Post procedure Call Back phone  # [phone]  Permission to leave phone message Yes     Patient questions:  Do you have a fever, pain , or abdominal swelling? No. Pain Score  0 *  Have you tolerated food without any problems? Yes.    Have you been able to return to your normal activities? Yes.    Do you have any questions about your discharge instructions: Diet   No. Medications  No. Follow up visit  No.  Do you have questions or concerns about your Care? No.  Actions: * If pain score is 4 or above: No action needed, pain <4.  1. Have you developed a fever since your procedure? no  2.   Have you had an respiratory symptoms (SOB or cough) since your procedure? no  3.   Have you tested positive for COVID 19 since your procedure no  4.   Have you had any family members/close contacts diagnosed with the COVID 19 since your procedure?  no   If yes to any of these questions please route to Joylene San, RN and Alphonsa Gin, Therapist, sports.

## 2019-06-26 ENCOUNTER — Encounter: Payer: Self-pay | Admitting: Gastroenterology

## 2020-02-13 ENCOUNTER — Emergency Department (HOSPITAL_COMMUNITY)
Admission: EM | Admit: 2020-02-13 | Discharge: 2020-02-14 | Disposition: A | Discharge status: Home / Self Care | Payer: BC Managed Care – PPO | Attending: Emergency Medicine | Admitting: Emergency Medicine

## 2020-02-13 ENCOUNTER — Encounter (HOSPITAL_COMMUNITY): Payer: Self-pay | Admitting: Emergency Medicine

## 2020-02-13 DIAGNOSIS — Z5321 Procedure and treatment not carried out due to patient leaving prior to being seen by health care provider: Secondary | ICD-10-CM | POA: Diagnosis not present

## 2020-02-13 DIAGNOSIS — M25562 Pain in left knee: Secondary | ICD-10-CM | POA: Diagnosis not present

## 2020-02-13 NOTE — ED Triage Notes (Signed)
Patient reports L knee pain post arthroscopy with debridement of meniscus. Was told by surgeon to report to ED if pain persisted.

## 2020-02-14 NOTE — ED Notes (Signed)
Pt checked out AMA. 

## 2020-11-03 IMAGING — DX DG KNEE 1-2V*L*
2 series · 2 of 2 positions shown · non-contrast
Comparison: None.

CLINICAL DATA: Fell down steps today with left knee pain.

EXAM:
LEFT KNEE - 1-2 VIEW

[knee ap]
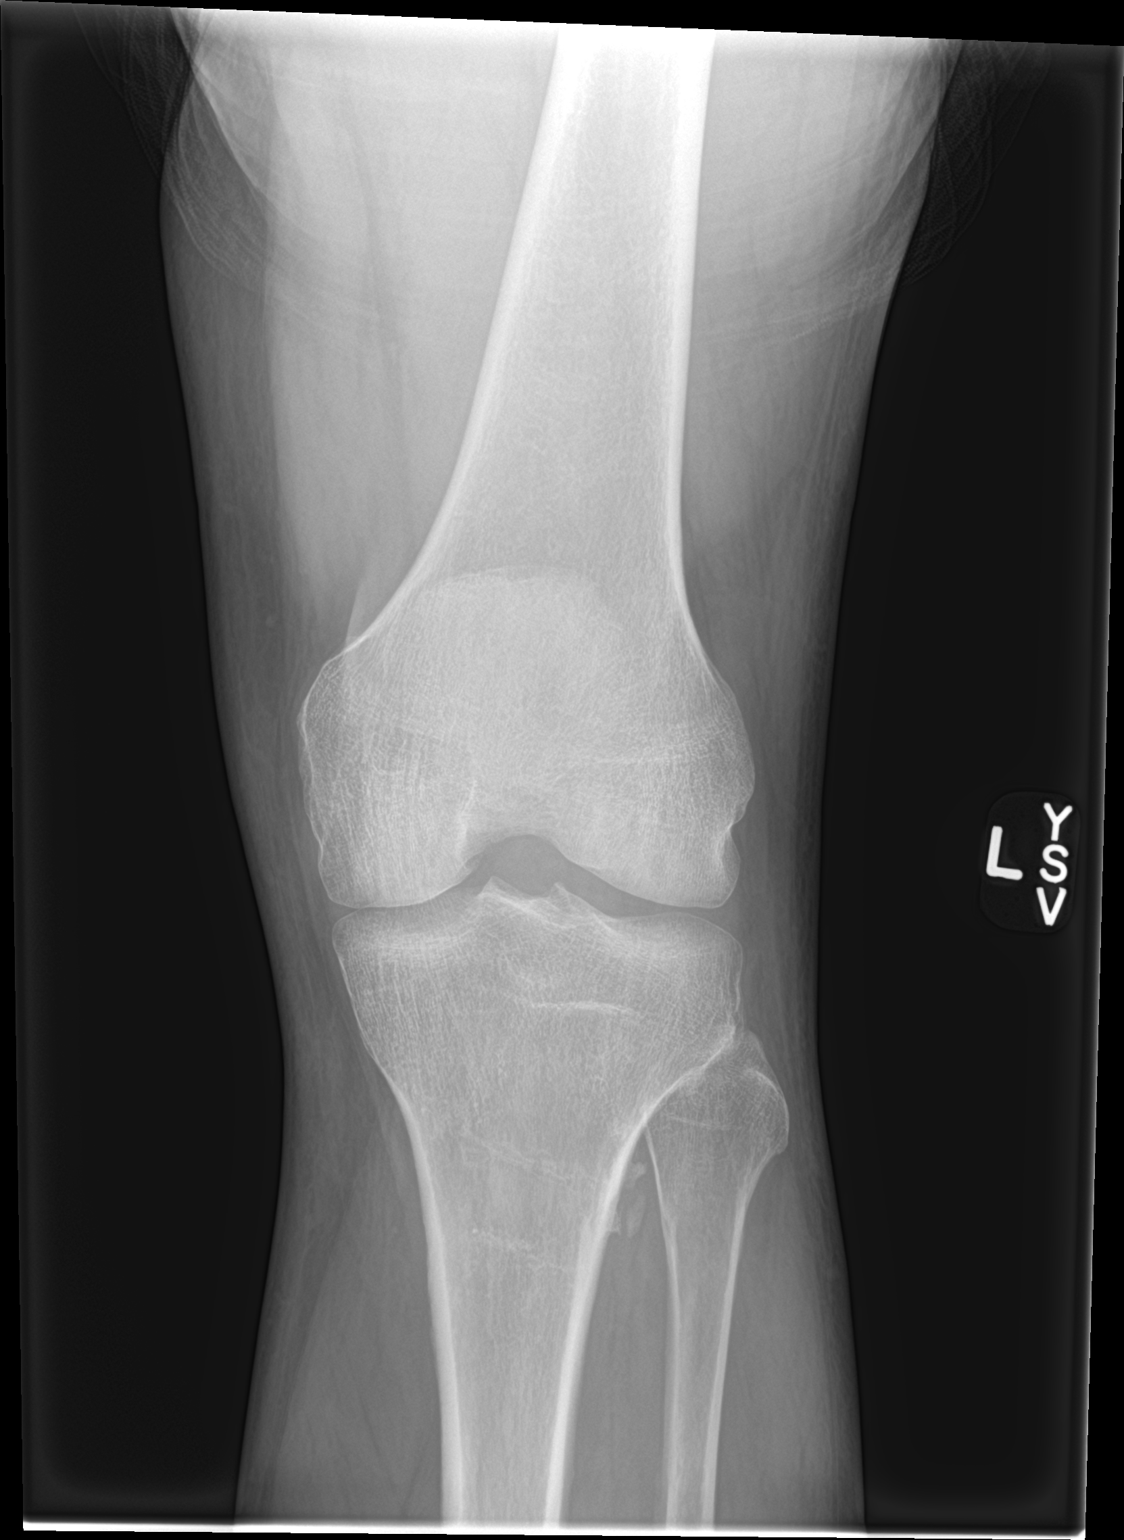

[knee lat]
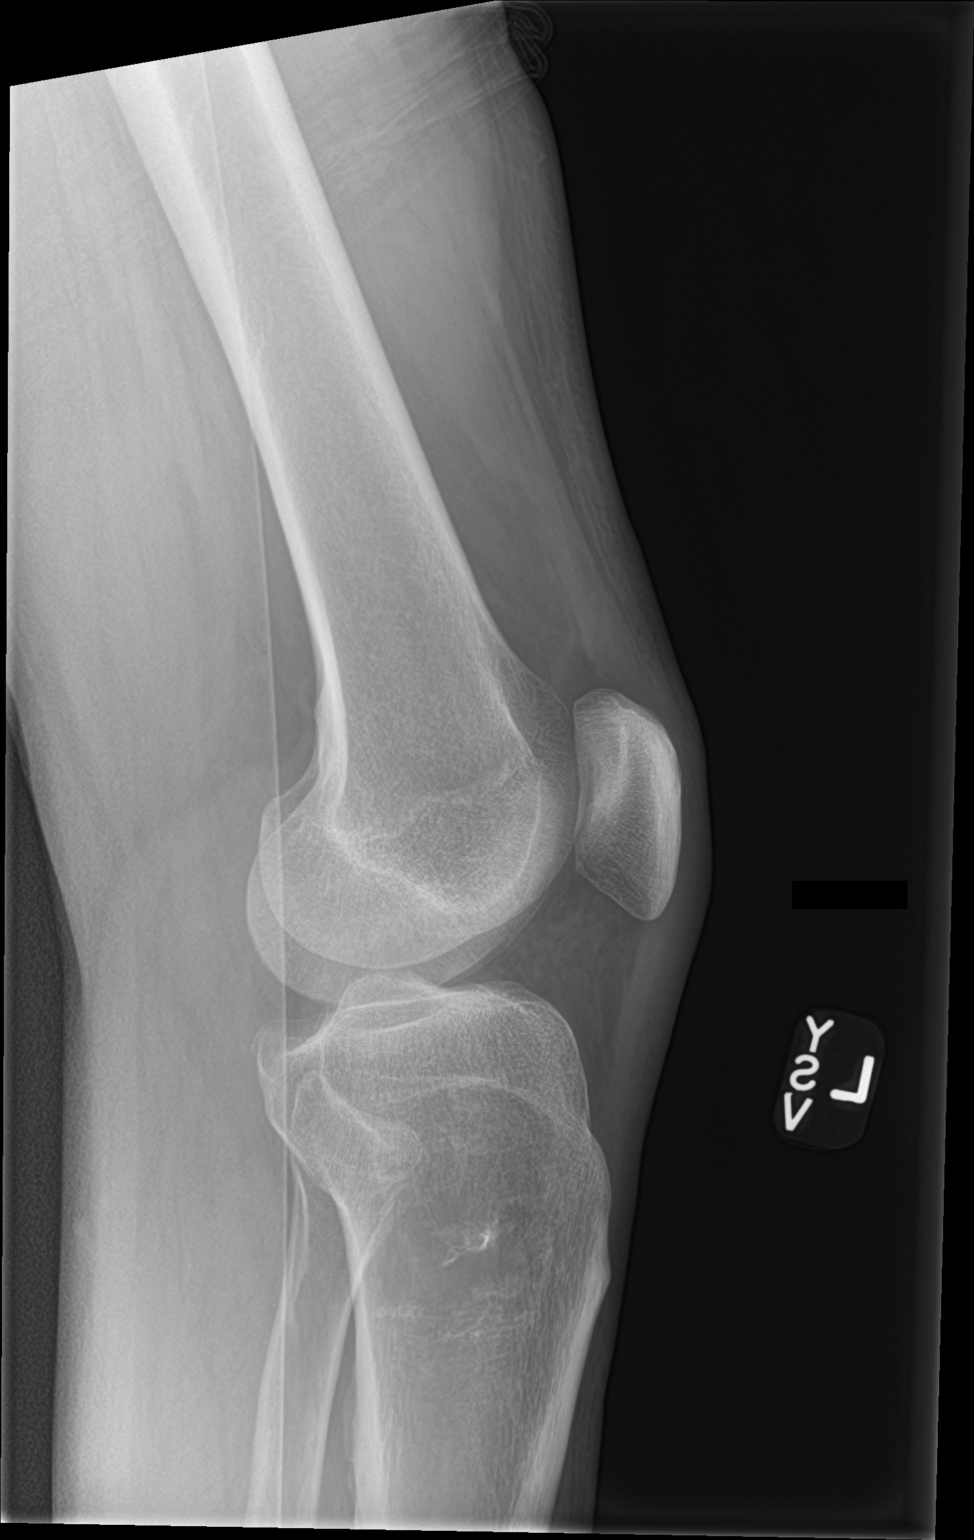

[2 of 2 positions shown; findings below may reference images not displayed]

FINDINGS: No evidence of fracture, dislocation, or joint effusion. No evidence
of arthropathy or other focal bone abnormality. Soft tissues are
unremarkable.
IMPRESSION: Negative.

## 2021-06-23 ENCOUNTER — Ambulatory Visit: Payer: Self-pay | Admitting: Physician Assistant

## 2021-08-02 ENCOUNTER — Ambulatory Visit: Payer: Self-pay | Admitting: Gastroenterology

## 2022-09-20 ENCOUNTER — Ambulatory Visit (HOSPITAL_COMMUNITY)
Admission: EM | Admit: 2022-09-20 | Discharge: 2022-09-20 | Disposition: A | Discharge status: Home / Self Care | Payer: No Payment, Other | Attending: Family | Admitting: Family

## 2022-09-20 DIAGNOSIS — R5383 Other fatigue: Secondary | ICD-10-CM | POA: Insufficient documentation

## 2022-09-20 DIAGNOSIS — F32A Depression, unspecified: Secondary | ICD-10-CM | POA: Insufficient documentation

## 2022-09-20 DIAGNOSIS — F4310 Post-traumatic stress disorder, unspecified: Secondary | ICD-10-CM | POA: Insufficient documentation
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
## 2022-09-20 NOTE — Discharge Instructions (Addendum)
Patient is instructed prior to discharge to:  Take all medications as prescribed by his/her mental healthcare provider. Report any adverse effects and or reactions from the medicines to his/her outpatient provider promptly. Keep all scheduled appointments, to ensure that you are getting refills on time and to avoid any interruption in your medication.  If you are unable to keep an appointment call to reschedule.  Be sure to follow-up with resources and follow-up appointments provided.  Patient has been instructed & cautioned: To not engage in alcohol and or illegal drug use while on prescription medicines. In the event of worsening symptoms, patient is instructed to call the crisis hotline, 911 and or go to the nearest ED for appropriate evaluation and treatment of symptoms. To follow-up with his/her primary care provider for your other medical issues, concerns and or health care needs.  Information: -National Suicide Prevention Lifeline 1-800-SUICIDE or 8721242558.  -988 offers 24/7 access to trained crisis counselors who can help people experiencing mental health-related distress. People can call or text 988 or chat 988lifeline.org for themselves or if they are worried about a loved one who may need crisis support.

## 2022-09-20 NOTE — Progress Notes (Signed)
   09/20/22 1846  [place] Triage Screening (Walk-ins at [facility] only)  How Did You Hear About [place]? Self  What Is the Reason for Your Visit/Call Today? Pt is a 57 yo male who presented voluntarily and accompanied by his fiance, [name], due to worsening depression and "a meltdown" in which he has been feeling more depressed and crying =. Pt denied SI, HI, NSSH, AVH, paranoia and any substance use. Pt has no current OP psychiatric OP providers. Hx of MDD, GAD and PTSD.  How Long Has This Been Causing You Problems? 1 wk - 1 month  Have You Recently Had Any Thoughts About Hurting Yourself? No  Are You Planning to Commit Suicide/Harm Yourself At This time? No  Have you Recently Had Thoughts About [place]? No  Are You Planning To Harm Someone At This Time? No  Are you currently experiencing any auditory, visual or other hallucinations? No  Have You Used Any Alcohol or Drugs in the Past 24 Hours? No  Do you have any current medical co-morbidities that require immediate attention? No  Clinician description of patient physical appearance/behavior: Pt was calm, cooperative, alert and seemed oriented. Pt's mood was depressed and his flat affect was congruent. At times he interjected humor possibly to deflect. Pt's judgment and insight seemed fair.  What Do You Feel Would Help You the Most Today? Treatment for Depression or other mood problem  If access to [facility] was not available, would you have sought care in the Emergency Department? Yes  Determination of Need Routine (7 days)  Options For Referral Medication Management;Outpatient Therapy   Kanoe Wanner T. Mare Ferrari, [place], [facility], [facility] Triage Specialist [facility]

## 2022-09-21 ENCOUNTER — Encounter (HOSPITAL_COMMUNITY): Payer: Self-pay

## 2022-09-21 ENCOUNTER — Ambulatory Visit (INDEPENDENT_AMBULATORY_CARE_PROVIDER_SITE_OTHER): Payer: No Payment, Other | Admitting: Psychiatry

## 2022-09-21 ENCOUNTER — Ambulatory Visit (INDEPENDENT_AMBULATORY_CARE_PROVIDER_SITE_OTHER): Payer: No Payment, Other | Admitting: Mental Health

## 2022-09-21 VITALS — BP 133/88 | HR 92

## 2022-09-21 DIAGNOSIS — F4322 Adjustment disorder with anxiety: Secondary | ICD-10-CM

## 2022-09-21 DIAGNOSIS — F515 Nightmare disorder: Secondary | ICD-10-CM

## 2022-09-21 MED ORDER — ESCITALOPRAM OXALATE 5 MG PO TABS: 5.0000 mg | ORAL_TABLET | Freq: Every day | ORAL | 1 refills | Status: DC

## 2022-09-21 MED ORDER — PRAZOSIN HCL 1 MG PO CAPS: 1.0000 mg | ORAL_CAPSULE | Freq: Every day | ORAL | 1 refills | Status: DC
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
## 2022-09-25 ENCOUNTER — Encounter (HOSPITAL_COMMUNITY): Payer: Self-pay | Admitting: Psychiatry

## 2022-10-19 ENCOUNTER — Encounter (HOSPITAL_COMMUNITY): Payer: No Payment, Other | Admitting: Psychiatry

## 2022-10-25 ENCOUNTER — Ambulatory Visit (INDEPENDENT_AMBULATORY_CARE_PROVIDER_SITE_OTHER): Payer: No Payment, Other | Admitting: Mental Health

## 2022-10-25 DIAGNOSIS — F4322 Adjustment disorder with anxiety: Secondary | ICD-10-CM | POA: Diagnosis not present
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
## 2022-10-26 ENCOUNTER — Ambulatory Visit (INDEPENDENT_AMBULATORY_CARE_PROVIDER_SITE_OTHER): Payer: No Payment, Other | Admitting: Psychiatry

## 2022-10-26 VITALS — BP 142/97 | HR 89

## 2022-10-26 DIAGNOSIS — F515 Nightmare disorder: Secondary | ICD-10-CM

## 2022-10-26 DIAGNOSIS — F4322 Adjustment disorder with anxiety: Secondary | ICD-10-CM | POA: Diagnosis not present

## 2022-10-26 MED ORDER — ESCITALOPRAM OXALATE 10 MG PO TABS: 10.0000 mg | ORAL_TABLET | Freq: Every day | ORAL | 2 refills | Status: DC

## 2022-10-26 MED ORDER — PRAZOSIN HCL 1 MG PO CAPS: 1.0000 mg | ORAL_CAPSULE | Freq: Every day | ORAL | 2 refills | Status: DC
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
## 2022-11-29 ENCOUNTER — Ambulatory Visit (HOSPITAL_COMMUNITY): Payer: No Payment, Other | Admitting: Mental Health

## 2022-12-28 ENCOUNTER — Encounter (HOSPITAL_COMMUNITY): Payer: No Payment, Other | Admitting: Psychiatry

## 2023-01-23 ENCOUNTER — Ambulatory Visit: Payer: Self-pay | Attending: Nurse Practitioner | Admitting: Nurse Practitioner

## 2023-04-24 ENCOUNTER — Ambulatory Visit (INDEPENDENT_AMBULATORY_CARE_PROVIDER_SITE_OTHER): Payer: No Payment, Other | Admitting: Student

## 2023-04-24 DIAGNOSIS — F4322 Adjustment disorder with anxiety: Secondary | ICD-10-CM

## 2023-04-24 DIAGNOSIS — F515 Nightmare disorder: Secondary | ICD-10-CM

## 2023-04-24 DIAGNOSIS — F4310 Post-traumatic stress disorder, unspecified: Secondary | ICD-10-CM | POA: Diagnosis not present

## 2023-04-24 MED ORDER — PRAZOSIN HCL 1 MG PO CAPS: 1.0000 mg | ORAL_CAPSULE | Freq: Every day | ORAL | 0 refills | Status: DC

## 2023-04-24 MED ORDER — ESCITALOPRAM OXALATE 10 MG PO TABS: 10.0000 mg | ORAL_TABLET | Freq: Every day | ORAL | 0 refills | Status: DC

## 2023-04-26 DIAGNOSIS — F4310 Post-traumatic stress disorder, unspecified: Secondary | ICD-10-CM | POA: Insufficient documentation

## 2023-04-26 NOTE — Progress Notes (Addendum)
BH MD Outpatient Progress Note  04/26/2023 2:35 PM James Rollins  MRN:  [id number]  Assessment:  James Rollins presents for follow-up evaluation in-person. Today, 04/26/23, patient reports wanting to restart psychotropic due to struggling with depression and passive suicidal ideation. Primary stressor appears to be related ex-wife. He was open to restarting lexapro for his depression and prazosin for his ptsd nightmares. Recommended restarting psychotherapy as well.   Identifying Information: James Rollins is a 57 y.o. male with a history of adjustment disorder, PTSD and MDD who is an established patient with [facility] for medication management.   Plan:  # Adjustment Disorder Past medication trials:  Status of problem: active Interventions: -- restart lexapro 10 mg daily -- restart psychotherapy  # PTSD Past medication trials:  Status of problem: active Interventions: -- restart prazosin 1 mg at bedtime   Return to care in 1 month  Patient was given contact information for behavioral health clinic and was instructed to call 911 for emergencies.    Patient and plan of care will be discussed with the Attending MD, Dr. Josephina Shih, who agrees with the above statement and plan.   Subjective:  Chief Complaint: Medication Management   Interval History:  Patient reports returning to psychiatry due to feeling more depressed recently following ex-wife's stalking him tracking his whereabouts.  He reports this has resulted in significant depressive symptoms that concerned him especially after last Friday when he started to have passive suicidal ideation.  This stopped after talking with his dog which provided him significant comfort.  He reports struggling with sleep as he reports significant nightmares regarding past trauma.  He denies present SI/HI/AVH.  Visit Diagnosis:    ICD-10-CM   1. Adjustment disorder with anxious mood  F43.22 escitalopram (LEXAPRO) 10 MG tablet     2. Nightmares  F51.5 prazosin (MINIPRESS) 1 MG capsule    3. PTSD (post-traumatic stress disorder)  F43.10       Past Psychiatric History:  Previous Psych Diagnoses: h/o MDD, PTSD Prior inpatient treatment: None Psychotherapy hx: Got some therapy 2 years ago for 4 months Previous suicidal attempts: Denies Previous medication trials: Lexapro ? 5 mg daily, thinks he tried BuSpar but not sure Current therapist: None  Past Medical History:  Past Medical History:  Diagnosis Date   Anxiety    Diabetes mellitus without complication (HCC)    Diverticulosis    Fatty liver    per CT 03-2019   Hypertension    Impaired fasting glucose    Mood disorder (HCC)    OSA (obstructive sleep apnea)    Umbilical hernia    per CT- 04-14-2019    Past Surgical History:  Procedure Laterality Date   ANKLE ARTHROTOMY     CHOLECYSTECTOMY      Family Psychiatric History: grandfather alcohol use disorder  Family History:  Family History  Problem Relation Age of Onset   Brain cancer Mother    Hypertension Father    Hyperlipidemia Father    Diabetes Mellitus II Father    Heart disease Father    COPD Father    Esophageal cancer Neg Hx    Stomach cancer Neg Hx    Rectal cancer Neg Hx    Colon cancer Neg Hx     Social History:  Academic/Vocational:  Social History   Socioeconomic History   Marital status: Married    Spouse name: Not on file   Number of children: Not on file   Years of education: Not on  file   Highest education level: Not on file  Occupational History   Not on file  Tobacco Use   Smoking status: Never   Smokeless tobacco: Never  Substance and Sexual Activity   Alcohol use: Not Currently   Drug use: Not Currently   Sexual activity: Not on file  Other Topics Concern   Not on file  Social History Narrative   Not on file   Social Determinants of Health   Financial Resource Strain: Low Risk  (09/21/2022)   Overall Financial Resource Strain (CARDIA)    Difficulty  of Paying Living Expenses: Not very hard  Food Insecurity: No Food Insecurity (09/21/2022)   Hunger Vital Sign    Worried About Running Out of Food in the Last Year: Never true    Ran Out of Food in the Last Year: Never true  Transportation Needs: No Transportation Needs (09/21/2022)   PRAPARE - Administrator, Civil Service (Medical): No    Lack of Transportation (Non-Medical): No  Physical Activity: Sufficiently Active (09/21/2022)   Exercise Vital Sign    Days of Exercise per Week: 3 days    Minutes of Exercise per Session: 90 min  Stress: Stress Concern Present (09/21/2022)   [employer] of Occupational Health - Occupational Stress Questionnaire    Feeling of Stress : Very much  Social Connections: Socially Isolated (09/21/2022)   Social Connection and Isolation Panel [NHANES]    Frequency of Communication with Friends and Family: Never    Frequency of Social Gatherings with Friends and Family: Never    Attends Religious Services: Never    Database administrator or Organizations: No    Attends Banker Meetings: Never    Marital Status: Divorced    Allergies:  Allergies  Allergen Reactions   Oxycontin [Oxycodone Hcl] Other (See Comments)    Mood swings   Onion Other (See Comments)    Dries out throat   Yellow Jacket Venom [Bee Venom] Nausea Only and Other (See Comments)    Current Medications: Current Outpatient Medications  Medication Sig Dispense Refill   acetaminophen (TYLENOL) 325 MG tablet Take 975 mg by mouth every 6 (six) hours as needed.     escitalopram (LEXAPRO) 10 MG tablet Take 1 tablet (10 mg total) by mouth daily. 30 tablet 0   prazosin (MINIPRESS) 1 MG capsule Take 1 capsule (1 mg total) by mouth at bedtime. 30 capsule 0   No current facility-administered medications for this visit.    ROS: Review of Systems   Objective:  Psychiatric Specialty Exam: There were no vitals taken for this visit.There is no height or weight on file  to calculate BMI.  General Appearance: Well Groomed  Eye Contact: Good  Speech:  Clear and Coherent  Volume:  Normal  Mood:  Depressed  Affect:  Appropriate and Congruent  Thought Content: Logical   Suicidal Thoughts:  No  Homicidal Thoughts:  No  Thought Process:  Coherent, Goal Directed, and Linear  Orientation:  Full (Time, Place, and Person)    Memory: Remote;   Fair  Judgment:  Intact  Insight:  Present  Concentration:  Concentration: Fair  Recall: not formally assessed   Fund of Knowledge: Fair  Language: Fair  Psychomotor Activity:  Normal  Akathisia:  No  AIMS (if indicated): not done  Assets:  Manufacturing systems engineer Desire for Improvement Financial Resources/Insurance Housing Intimacy Leisure Time Physical Health Resilience Social Support Talents/Skills Transportation Vocational/Educational  ADL's:  Intact  Cognition:  WNL  Sleep:  Fair   PE: General: well-appearing; no acute distress  Pulm: no increased work of breathing on room air  Strength & Muscle Tone: within normal limits Neuro: no focal neurological deficits observed  Gait & Station: normal  Metabolic Disorder Labs: No results found for: "HGBA1C", "MPG" No results found for: "PROLACTIN" No results found for: "CHOL", "TRIG", "HDL", "CHOLHDL", "VLDL", "LDLCALC" No results found for: "TSH"  Therapeutic Level Labs: No results found for: "LITHIUM" No results found for: "VALPROATE" No results found for: "CBMZ"  Screenings: GAD-7    Flowsheet Row Counselor from 10/25/2022 in [facility] Counselor from 09/21/2022 in [facility]  Total GAD-7 Score 0 3      PHQ2-9    Flowsheet Row Counselor from 10/25/2022 in [facility] Counselor from 09/21/2022 in [facility]  PHQ-2 Total Score 1 4  PHQ-9 Total Score 2 12      Flowsheet Row Counselor from 09/21/2022 in [facility]  C-SSRS RISK CATEGORY No Risk       Collaboration of Care: Collaboration of Care:  Patient/Guardian was advised Release of Information must be obtained prior to any record release in order to collaborate their care with an outside provider. Patient/Guardian was advised if they have not already done so to contact the registration department to sign all necessary forms in order for [place] to release information regarding their care.   Consent: Patient/Guardian gives verbal consent for treatment and assignment of benefits for services provided during this visit. Patient/Guardian expressed understanding and agreed to proceed.   Park Pope, MD 04/26/2023, 2:35 PM

## 2023-05-24 NOTE — Progress Notes (Signed)
BH MD Outpatient Progress Note  05/25/2023 9:07 AM Barkon Clink  MRN:  [id number]  Assessment:  James Rollins presents for follow-up evaluation in-person. Today, 05/25/23, patient reports significant improvement since last visit with regards to mood and anxiety.  There does seem to be some residual depression and anxiety for which we will increase Lexapro to better address.  Nightmares have ceased following initiation of prazosin.  Patient to follow-up in approximately 2 months.  Patient has psychotherapy appointment with Stephan Minister on 06/11/2023.  Patient will follow-up with me on 07/26/2023.    Identifying Information: James Rollins is a 57 y.o. male with a history of adjustment disorder, PTSD and MDD who is an established patient with [facility] for medication management.   Plan:  # Adjustment Disorder Past medication trials:  Status of problem: improving Interventions: -- increase lexapro 15 mg daily -- continue psychotherapy  # PTSD Past medication trials:  Status of problem: active Interventions: -- continue prazosin 1 mg at bedtime   Return to care in 1 month  Patient was given contact information for behavioral health clinic and was instructed to call 911 for emergencies.    Patient and plan of care will be discussed with the Attending MD, Dr. Josephina Shih, who agrees with the above statement and plan.   Subjective:  Chief Complaint: Medication Management   Interval History:  Patient reports significant improvement in anxiety and depression following initiation of escitalopram.  He does state that there is still mild depression anxiety that seem to persist but are significantly more manageable than before.  He reports that nightmares have completely resolved since initiation of prazosin.  He also reports improvement in stress due to his ex-wife no longer bothering him. He denies present SI/HI/AVH.  He has no acute somatic complaints with regards to  initiation of psychotropics again.  He was amenable to getting routine labs.  He reports sleep has significantly improved and appetite has been appropriate.  Visit Diagnosis:    ICD-10-CM   1. Adjustment disorder with anxious mood  F43.22 VITAMIN D 25 Hydroxy (Vit-D Deficiency, Fractures)    Lipid Profile    Hemoglobin A1c    TSH + free T4    CBC w/Diff/Platelet    Comprehensive Metabolic Panel (CMET)    escitalopram (LEXAPRO) 10 MG tablet    2. PTSD (post-traumatic stress disorder)  F43.10     3. Nightmares  F51.5 prazosin (MINIPRESS) 1 MG capsule       Past Psychiatric History:  Previous Psych Diagnoses: h/o MDD, PTSD Prior inpatient treatment: None Psychotherapy hx: Got some therapy 2 years ago for 4 months Previous suicidal attempts: Denies Previous medication trials: Lexapro ? 5 mg daily, thinks he tried BuSpar but not sure Current therapist: None  Past Medical History:  Past Medical History:  Diagnosis Date   Anxiety    Diabetes mellitus without complication (HCC)    Diverticulosis    Fatty liver    per CT 03-2019   Hypertension    Impaired fasting glucose    Mood disorder (HCC)    OSA (obstructive sleep apnea)    Umbilical hernia    per CT- 04-14-2019    Past Surgical History:  Procedure Laterality Date   ANKLE ARTHROTOMY     CHOLECYSTECTOMY      Family Psychiatric History: grandfather alcohol use disorder  Family History:  Family History  Problem Relation Age of Onset   Brain cancer Mother    Hypertension Father    Hyperlipidemia  Father    Diabetes Mellitus II Father    Heart disease Father    COPD Father    Esophageal cancer Neg Hx    Stomach cancer Neg Hx    Rectal cancer Neg Hx    Colon cancer Neg Hx     Social History:  Academic/Vocational:  Social History   Socioeconomic History   Marital status: Married    Spouse name: Not on file   Number of children: Not on file   Years of education: Not on file   Highest education level: Not on  file  Occupational History   Not on file  Tobacco Use   Smoking status: Never   Smokeless tobacco: Never  Substance and Sexual Activity   Alcohol use: Not Currently   Drug use: Not Currently   Sexual activity: Not on file  Other Topics Concern   Not on file  Social History Narrative   Not on file   Social Determinants of Health   Financial Resource Strain: Low Risk  (09/21/2022)   Overall Financial Resource Strain (CARDIA)    Difficulty of Paying Living Expenses: Not very hard  Food Insecurity: No Food Insecurity (09/21/2022)   Hunger Vital Sign    Worried About Running Out of Food in the Last Year: Never true    Ran Out of Food in the Last Year: Never true  Transportation Needs: No Transportation Needs (09/21/2022)   PRAPARE - Administrator, Civil Service (Medical): No    Lack of Transportation (Non-Medical): No  Physical Activity: Sufficiently Active (09/21/2022)   Exercise Vital Sign    Days of Exercise per Week: 3 days    Minutes of Exercise per Session: 90 min  Stress: Stress Concern Present (09/21/2022)   [employer] of Occupational Health - Occupational Stress Questionnaire    Feeling of Stress : Very much  Social Connections: Socially Isolated (09/21/2022)   Social Connection and Isolation Panel [NHANES]    Frequency of Communication with Friends and Family: Never    Frequency of Social Gatherings with Friends and Family: Never    Attends Religious Services: Never    Database administrator or Organizations: No    Attends Banker Meetings: Never    Marital Status: Divorced    Allergies:  Allergies  Allergen Reactions   Oxycontin [Oxycodone Hcl] Other (See Comments)    Mood swings   Onion Other (See Comments)    Dries out throat   Yellow Jacket Venom [Bee Venom] Nausea Only and Other (See Comments)    Current Medications: Current Outpatient Medications  Medication Sig Dispense Refill   acetaminophen (TYLENOL) 325 MG tablet Take 975 mg  by mouth every 6 (six) hours as needed.     escitalopram (LEXAPRO) 10 MG tablet Take 1.5 tablets (15 mg total) by mouth daily. 90 tablet 1   prazosin (MINIPRESS) 1 MG capsule Take 1 capsule (1 mg total) by mouth at bedtime. 60 capsule 1   No current facility-administered medications for this visit.    ROS: Review of Systems   Objective:  Psychiatric Specialty Exam: There were no vitals taken for this visit.There is no height or weight on file to calculate BMI.  General Appearance: Well Groomed  Eye Contact: Good  Speech:  Clear and Coherent  Volume:  Normal  Mood:  Depressed  Affect:  Appropriate and Congruent  Thought Content: Logical   Suicidal Thoughts:  No  Homicidal Thoughts:  No  Thought Process:  Coherent,  Goal Directed, and Linear  Orientation:  Full (Time, Place, and Person)    Memory: Remote;   Fair  Judgment:  Intact  Insight:  Present  Concentration:  Concentration: Fair  Recall: not formally assessed   Fund of Knowledge: Fair  Language: Fair  Psychomotor Activity:  Normal  Akathisia:  No  AIMS (if indicated): not done  Assets:  Manufacturing systems engineer Desire for Improvement Financial Resources/Insurance Housing Intimacy Leisure Time Physical Health Resilience Social Support Talents/Skills Transportation Vocational/Educational  ADL's:  Intact  Cognition: WNL  Sleep:  Fair   PE: General: well-appearing; no acute distress  Pulm: no increased work of breathing on room air  Strength & Muscle Tone: within normal limits Neuro: no focal neurological deficits observed  Gait & Station: normal  Metabolic Disorder Labs: No results found for: "HGBA1C", "MPG" No results found for: "PROLACTIN" No results found for: "CHOL", "TRIG", "HDL", "CHOLHDL", "VLDL", "LDLCALC" No results found for: "TSH"  Therapeutic Level Labs: No results found for: "LITHIUM" No results found for: "VALPROATE" No results found for: "CBMZ"  Screenings: GAD-7    Flowsheet Row  Counselor from 10/25/2022 in [facility] Counselor from 09/21/2022 in [facility]  Total GAD-7 Score 0 3      PHQ2-9    Flowsheet Row Counselor from 10/25/2022 in [facility] Counselor from 09/21/2022 in [facility]  PHQ-2 Total Score 1 4  PHQ-9 Total Score 2 12      Flowsheet Row Counselor from 09/21/2022 in [facility]  C-SSRS RISK CATEGORY No Risk       Collaboration of Care: Collaboration of Care:  Patient/Guardian was advised Release of Information must be obtained prior to any record release in order to collaborate their care with an outside provider. Patient/Guardian was advised if they have not already done so to contact the registration department to sign all necessary forms in order for [place] to release information regarding their care.   Consent: Patient/Guardian gives verbal consent for treatment and assignment of benefits for services provided during this visit. Patient/Guardian expressed understanding and agreed to proceed.   Park Pope, MD 05/25/2023, 9:07 AM

## 2023-05-25 ENCOUNTER — Ambulatory Visit (INDEPENDENT_AMBULATORY_CARE_PROVIDER_SITE_OTHER): Payer: No Payment, Other | Admitting: Student

## 2023-05-25 DIAGNOSIS — F4310 Post-traumatic stress disorder, unspecified: Secondary | ICD-10-CM

## 2023-05-25 DIAGNOSIS — F515 Nightmare disorder: Secondary | ICD-10-CM | POA: Diagnosis not present

## 2023-05-25 DIAGNOSIS — F4322 Adjustment disorder with anxiety: Secondary | ICD-10-CM

## 2023-05-25 MED ORDER — PRAZOSIN HCL 1 MG PO CAPS: 1.0000 mg | ORAL_CAPSULE | Freq: Every day | ORAL | 1 refills | Status: AC

## 2023-05-25 MED ORDER — ESCITALOPRAM OXALATE 10 MG PO TABS: 15.0000 mg | ORAL_TABLET | Freq: Every day | ORAL | 1 refills | Status: AC

## 2023-06-04 ENCOUNTER — Other Ambulatory Visit (INDEPENDENT_AMBULATORY_CARE_PROVIDER_SITE_OTHER): Payer: No Payment, Other

## 2023-06-04 DIAGNOSIS — F4322 Adjustment disorder with anxiety: Secondary | ICD-10-CM | POA: Diagnosis not present

## 2023-06-05 ENCOUNTER — Other Ambulatory Visit (HOSPITAL_COMMUNITY): Payer: Self-pay | Admitting: Psychiatry

## 2023-06-05 NOTE — Progress Notes (Signed)
Pt tolerated labs well with no complaints

## 2023-06-06 LAB — CBC WITH DIFFERENTIAL/PLATELET
Basophils Absolute: 0 10*3/uL (ref 0.0–0.2)
Basos: 1 %
EOS (ABSOLUTE): 0.1 10*3/uL (ref 0.0–0.4)
Eos: 2 %
Hematocrit: 48.9 % (ref 37.5–51.0)
Hemoglobin: 15.5 g/dL (ref 13.0–17.7)
Immature Grans (Abs): 0 10*3/uL (ref 0.0–0.1)
Immature Granulocytes: 0 %
Lymphocytes Absolute: 1.5 10*3/uL (ref 0.7–3.1)
Lymphs: 26 %
MCH: 28.8 pg (ref 26.6–33.0)
MCHC: 31.7 g/dL (ref 31.5–35.7)
MCV: 91 fL (ref 79–97)
Monocytes Absolute: 0.5 10*3/uL (ref 0.1–0.9)
Monocytes: 9 %
Neutrophils Absolute: 3.6 10*3/uL (ref 1.4–7.0)
Neutrophils: 62 %
Platelets: 240 10*3/uL (ref 150–450)
RBC: 5.39 x10E6/uL (ref 4.14–5.80)
RDW: 12.9 % (ref 11.6–15.4)
WBC: 5.8 10*3/uL (ref 3.4–10.8)

## 2023-06-06 LAB — SPECIMEN STATUS REPORT

## 2023-06-06 LAB — COMPREHENSIVE METABOLIC PANEL
ALT: 27 [IU]/L (ref 0–44)
AST: 16 [IU]/L (ref 0–40)
Albumin: 4.5 g/dL (ref 3.8–4.9)
Alkaline Phosphatase: 145 [IU]/L — ABNORMAL HIGH (ref 44–121)
BUN/Creatinine Ratio: 23 — ABNORMAL HIGH (ref 9–20)
BUN: 22 mg/dL (ref 6–24)
Bilirubin Total: 0.3 mg/dL (ref 0.0–1.2)
CO2: 18 mmol/L — ABNORMAL LOW (ref 20–29)
Calcium: 10 mg/dL (ref 8.7–10.2)
Chloride: 104 mmol/L (ref 96–106)
Creatinine, Ser: 0.96 mg/dL (ref 0.76–1.27)
Globulin, Total: 2.6 g/dL (ref 1.5–4.5)
Glucose: 315 mg/dL — ABNORMAL HIGH (ref 70–99)
Potassium: 4.7 mmol/L (ref 3.5–5.2)
Sodium: 139 mmol/L (ref 134–144)
Total Protein: 7.1 g/dL (ref 6.0–8.5)
eGFR: 93 mL/min/{1.73_m2} (ref >59)

## 2023-06-06 LAB — LIPID PANEL
Chol/HDL Ratio: 5.3 ratio — ABNORMAL HIGH (ref 0.0–5.0)
Cholesterol, Total: 228 mg/dL — ABNORMAL HIGH (ref 100–199)
HDL: 43 mg/dL (ref >39)
LDL Chol Calc (NIH): 115 mg/dL — ABNORMAL HIGH (ref 0–99)
Triglycerides: 404 mg/dL — ABNORMAL HIGH (ref 0–149)
VLDL Cholesterol Cal: 70 mg/dL — ABNORMAL HIGH (ref 5–40)

## 2023-06-06 LAB — TSH+FREE T4
Free T4: 1.28 ng/dL (ref 0.82–1.77)
TSH: 0.928 u[IU]/mL (ref 0.450–4.500)

## 2023-06-06 LAB — HEMOGLOBIN A1C
Est. average glucose Bld gHb Est-mCnc: 266 mg/dL
Hgb A1c MFr Bld: 10.9 % — ABNORMAL HIGH (ref 4.8–5.6)

## 2023-06-06 LAB — VITAMIN D 25 HYDROXY (VIT D DEFICIENCY, FRACTURES): Vit D, 25-Hydroxy: 18.2 ng/mL — ABNORMAL LOW (ref 30.0–100.0)

## 2023-06-11 ENCOUNTER — Encounter (HOSPITAL_COMMUNITY): Payer: Self-pay

## 2023-06-11 ENCOUNTER — Ambulatory Visit (HOSPITAL_COMMUNITY): Payer: No Payment, Other | Admitting: Mental Health

## 2023-07-25 NOTE — Progress Notes (Deleted)
 BH MD Outpatient Progress Note  07/25/2023 5:28 PM James Rollins  MRN:  [id number]  Assessment:  James Rollins presents for follow-up evaluation in-person. Today, 07/25/23, patient reports significant improvement since last visit with regards to mood and anxiety.  There does seem to be some residual depression and anxiety for which we will increase Lexapro  to better address.  Nightmares have ceased following initiation of prazosin .  Patient to follow-up in approximately 2 months.  Patient has psychotherapy appointment with Bernice Rao on 06/11/2023.  Patient will follow-up with me on 07/26/2023.    Identifying Information: James Rollins is a 58 y.o. male with a history of adjustment disorder, PTSD and MDD who is an established patient with [facility] for medication management.   Plan:  # Adjustment Disorder Past medication trials:  Status of problem: improving Interventions: -- increase lexapro  15 mg daily -- continue psychotherapy  # PTSD Past medication trials:  Status of problem: active Interventions: -- continue prazosin  1 mg at bedtime   Return to care in 1 month  Patient was given contact information for behavioral health clinic and was instructed to call 911 for emergencies.    Patient and plan of care will be discussed with the Attending MD, Dr. Mercy, who agrees with the above statement and plan.   Subjective:  Chief Complaint: Medication Management   Interval History:  Patient reports significant improvement in anxiety and depression following initiation of escitalopram .  He does state that there is still mild depression anxiety that seem to persist but are significantly more manageable than before.  He reports that nightmares have completely resolved since initiation of prazosin .  He also reports improvement in stress due to his ex-wife no longer bothering him. He denies present SI/HI/AVH.  He has no acute somatic complaints with regards to initiation  of psychotropics again.  He was amenable to getting routine labs.  He reports sleep has significantly improved and appetite has been appropriate.  Visit Diagnosis:  No diagnosis found.    Past Psychiatric History:  Previous Psych Diagnoses: h/o MDD, PTSD Prior inpatient treatment: None Psychotherapy hx: Got some therapy 2 years ago for 4 months Previous suicidal attempts: Denies Previous medication trials: Lexapro  ? 5 mg daily, thinks he tried BuSpar but not sure Current therapist: None  Past Medical History:  Past Medical History:  Diagnosis Date   Anxiety    Diabetes mellitus without complication (HCC)    Diverticulosis    Fatty liver    per CT 03-2019   Hypertension    Impaired fasting glucose    Mood disorder (HCC)    OSA (obstructive sleep apnea)    Umbilical hernia    per CT- 04-14-2019    Past Surgical History:  Procedure Laterality Date   ANKLE ARTHROTOMY     CHOLECYSTECTOMY      Family Psychiatric History: grandfather alcohol use disorder  Family History:  Family History  Problem Relation Age of Onset   Brain cancer Mother    Hypertension Father    Hyperlipidemia Father    Diabetes Mellitus II Father    Heart disease Father    COPD Father    Esophageal cancer Neg Hx    Stomach cancer Neg Hx    Rectal cancer Neg Hx    Colon cancer Neg Hx     Social History:  Academic/Vocational:  Social History   Socioeconomic History   Marital status: Married    Spouse name: Not on file   Number of children:  Not on file   Years of education: Not on file   Highest education level: Not on file  Occupational History   Not on file  Tobacco Use   Smoking status: Never   Smokeless tobacco: Never  Substance and Sexual Activity   Alcohol use: Not Currently   Drug use: Not Currently   Sexual activity: Not on file  Other Topics Concern   Not on file  Social History Narrative   Not on file   Social Drivers of Health   Financial Resource Strain: Low Risk   (09/21/2022)   Overall Financial Resource Strain (CARDIA)    Difficulty of Paying Living Expenses: Not very hard  Food Insecurity: No Food Insecurity (09/21/2022)   Hunger Vital Sign    Worried About Running Out of Food in the Last Year: Never true    Ran Out of Food in the Last Year: Never true  Transportation Needs: No Transportation Needs (09/21/2022)   PRAPARE - Administrator, Civil Service (Medical): No    Lack of Transportation (Non-Medical): No  Physical Activity: Sufficiently Active (09/21/2022)   Exercise Vital Sign    Days of Exercise per Week: 3 days    Minutes of Exercise per Session: 90 min  Stress: Stress Concern Present (09/21/2022)   [employer] of Occupational Health - Occupational Stress Questionnaire    Feeling of Stress : Very much  Social Connections: Socially Isolated (09/21/2022)   Social Connection and Isolation Panel [NHANES]    Frequency of Communication with Friends and Family: Never    Frequency of Social Gatherings with Friends and Family: Never    Attends Religious Services: Never    Database Administrator or Organizations: No    Attends Banker Meetings: Never    Marital Status: Divorced    Allergies:  Allergies  Allergen Reactions   Oxycontin [Oxycodone Hcl] Other (See Comments)    Mood swings   Onion Other (See Comments)    Dries out throat   Yellow Jacket Venom [Bee Venom] Nausea Only and Other (See Comments)    Current Medications: Current Outpatient Medications  Medication Sig Dispense Refill   acetaminophen  (TYLENOL ) 325 MG tablet Take 975 mg by mouth every 6 (six) hours as needed.     escitalopram  (LEXAPRO ) 10 MG tablet Take 1.5 tablets (15 mg total) by mouth daily. 90 tablet 1   prazosin  (MINIPRESS ) 1 MG capsule Take 1 capsule (1 mg total) by mouth at bedtime. 60 capsule 1   No current facility-administered medications for this visit.    ROS: Review of Systems   Objective:  Psychiatric Specialty  Exam: There were no vitals taken for this visit.There is no height or weight on file to calculate BMI.  General Appearance: Well Groomed  Eye Contact: Good  Speech:  Clear and Coherent  Volume:  Normal  Mood:  Depressed  Affect:  Appropriate and Congruent  Thought Content: Logical   Suicidal Thoughts:  No  Homicidal Thoughts:  No  Thought Process:  Coherent, Goal Directed, and Linear  Orientation:  Full (Time, Place, and Person)    Memory: Remote;   Fair  Judgment:  Intact  Insight:  Present  Concentration:  Concentration: Fair  Recall: not formally assessed   Fund of Knowledge: Fair  Language: Fair  Psychomotor Activity:  Normal  Akathisia:  No  AIMS (if indicated): not done  Assets:  Communication Skills Desire for Improvement Financial Resources/Insurance Housing Intimacy Leisure Time Physical Health Resilience Social  Support Talents/Skills Transportation Vocational/Educational  ADL's:  Intact  Cognition: WNL  Sleep:  Fair   PE: General: well-appearing; no acute distress  Pulm: no increased work of breathing on room air  Strength & Muscle Tone: within normal limits Neuro: no focal neurological deficits observed  Gait & Station: normal  Metabolic Disorder Labs: Lab Results  Component Value Date   HGBA1C 10.9 (H) 06/05/2023   No results found for: PROLACTIN Lab Results  Component Value Date   CHOL 228 (H) 06/05/2023   TRIG 404 (H) 06/05/2023   HDL 43 06/05/2023   CHOLHDL 5.3 (H) 06/05/2023   LDLCALC 115 (H) 06/05/2023   Lab Results  Component Value Date   TSH 0.928 06/05/2023    Therapeutic Level Labs: No results found for: LITHIUM No results found for: VALPROATE No results found for: CBMZ  Screenings: GAD-7    Flowsheet Row Counselor from 10/25/2022 in [facility] Counselor from 09/21/2022 in [facility]  Total GAD-7 Score 0 3      PHQ2-9    Flowsheet Row Counselor from  10/25/2022 in [facility] Counselor from 09/21/2022 in [facility]  PHQ-2 Total Score 1 4  PHQ-9 Total Score 2 12      Flowsheet Row Counselor from 09/21/2022 in [facility]  C-SSRS RISK CATEGORY No Risk       Collaboration of Care: Collaboration of Care:  Patient/Guardian was advised Release of Information must be obtained prior to any record release in order to collaborate their care with an outside provider. Patient/Guardian was advised if they have not already done so to contact the registration department to sign all necessary forms in order for us  to release information regarding their care.   Consent: Patient/Guardian gives verbal consent for treatment and assignment of benefits for services provided during this visit. Patient/Guardian expressed understanding and agreed to proceed.   Prentice Espy, MD 07/25/2023, 5:28 PM

## 2023-07-26 ENCOUNTER — Encounter (HOSPITAL_COMMUNITY): Payer: No Payment, Other | Admitting: Student
